# Patient Record
Sex: Female | Born: 1965 | Race: White | Hispanic: No | Marital: Married | Smoking: Former smoker
Health system: Southern US, Community
[De-identification: ages and names within clinical notes are randomized; demographics above are authoritative.]

---

## 2014-12-25 ENCOUNTER — Emergency Department
Admission: EM | Admit: 2014-12-25 | Discharge: 2014-12-25 | Disposition: A | Discharge status: Home / Self Care | Payer: BLUE CROSS/BLUE SHIELD | Attending: Student | Admitting: Student

## 2014-12-25 ENCOUNTER — Other Ambulatory Visit: Payer: Self-pay

## 2014-12-25 ENCOUNTER — Emergency Department: Payer: BLUE CROSS/BLUE SHIELD

## 2014-12-25 DIAGNOSIS — R002 Palpitations: Secondary | ICD-10-CM | POA: Insufficient documentation

## 2014-12-25 DIAGNOSIS — M25512 Pain in left shoulder: Secondary | ICD-10-CM | POA: Diagnosis not present

## 2014-12-25 DIAGNOSIS — Z87891 Personal history of nicotine dependence: Secondary | ICD-10-CM | POA: Insufficient documentation

## 2014-12-25 DIAGNOSIS — Z3202 Encounter for pregnancy test, result negative: Secondary | ICD-10-CM | POA: Diagnosis not present

## 2014-12-25 DIAGNOSIS — R202 Paresthesia of skin: Secondary | ICD-10-CM | POA: Diagnosis not present

## 2014-12-25 LAB — BASIC METABOLIC PANEL
Anion gap: 8 (ref 5–15)
BUN: 15 mg/dL (ref 6–20)
CALCIUM: 9.5 mg/dL (ref 8.9–10.3)
CO2: 26 mmol/L (ref 22–32)
CREATININE: 0.78 mg/dL (ref 0.44–1.00)
Chloride: 105 mmol/L (ref 101–111)
Glucose, Bld: 111 mg/dL — ABNORMAL HIGH (ref 65–99)
Potassium: 3.6 mmol/L (ref 3.5–5.1)
Sodium: 139 mmol/L (ref 135–145)

## 2014-12-25 LAB — CBC
HCT: 43.3 % (ref 35.0–47.0)
Hemoglobin: 14.2 g/dL (ref 12.0–16.0)
MCH: 29.8 pg (ref 26.0–34.0)
MCHC: 32.7 g/dL (ref 32.0–36.0)
MCV: 91.1 fL (ref 80.0–100.0)
PLATELETS: 215 10*3/uL (ref 150–440)
RBC: 4.75 MIL/uL (ref 3.80–5.20)
RDW: 13.7 % (ref 11.5–14.5)
WBC: 7.6 10*3/uL (ref 3.6–11.0)

## 2014-12-25 LAB — TSH: TSH: 1.673 u[IU]/mL (ref 0.350–4.500)

## 2014-12-25 LAB — TROPONIN I

## 2014-12-25 LAB — T4, FREE: Free T4: 1.03 ng/dL (ref 0.61–1.12)

## 2014-12-25 NOTE — ED Notes (Signed)
Pt c/o left arm tingling for the past 3-4 days, today having palpitations, feeling like her heart is racing with diaphoresis and nausea.Carla Hamilton

## 2014-12-25 NOTE — ED Notes (Signed)
POC urine pregnancy completed and negative. MD aware

## 2014-12-25 NOTE — ED Notes (Signed)
Patient transported to X-ray 

## 2014-12-25 NOTE — ED Provider Notes (Signed)
[facility] Emergency Department Provider Note  ____________________________________________  Time seen: Approximately 11:38 AM  I have reviewed the triage vital signs and the nursing notes.   HISTORY  Chief Complaint Palpitations    HPI Carla Hamilton is a 49 y.o. female with no chronic medical problems who presents for evaluation of gradual onset mild intermittent left shoulder throbbing discomfort with associated tingling in the left arm which began 4 days ago. She thought this may be due to a pinched nerve. This morning she also felt like she felt her heart beating quickly and felt a little nauseated and this concerned her. Her symptoms are not exertional, not pleuritic and she denies any chest pain at any time. No shortness of breath. Current severity of symptoms is mild. No modifying factors. No recent illness and she is otherwise been in her usual state of health.   History reviewed. No pertinent past medical history.  There are no active problems to display for this patient.   History reviewed. No pertinent past surgical history.  No current outpatient prescriptions on file.  Allergies Review of patient's allergies indicates no known allergies.  No family history on file.  Social History Social History  Substance Use Topics  . Smoking status: Former Games developer  . Smokeless tobacco: Never Used  . Alcohol Use: Yes    Review of Systems Constitutional: No fever/chills Eyes: No visual changes. ENT: No sore throat. Cardiovascular: Denies chest pain. Respiratory: Denies shortness of breath. Gastrointestinal: No abdominal pain.  No nausea, no vomiting.  No diarrhea.  No constipation. Genitourinary: Negative for dysuria. Musculoskeletal: Negative for back pain. Skin: Negative for rash. Neurological: Negative for headaches, focal weakness or numbness.  10-point ROS otherwise  negative.  ____________________________________________   PHYSICAL EXAM:  VITAL SIGNS: ED Triage Vitals  Enc Vitals Group     BP 12/25/14 0909 119/54 mmHg     Pulse Rate 12/25/14 0909 92     Resp 12/25/14 0909 18     Temp 12/25/14 0909 97.9 F (36.6 C)     Temp Source 12/25/14 0909 Oral     SpO2 12/25/14 0909 100 %     Weight 12/25/14 0909 135 lb (61.236 kg)     Height 12/25/14 0909  (1.626 m)     Head Cir --      Peak Flow --      Pain Score 12/25/14 0909 4     Pain Loc --      Pain Edu? --      Excl. in GC? --     Constitutional: Alert and oriented. Well appearing and in no acute distress. Eyes: Conjunctivae are normal. PERRL. EOMI. Head: Atraumatic. Nose: No congestion/rhinnorhea. Mouth/Throat: Mucous membranes are moist.  Oropharynx non-erythematous. Neck: No stridor. Supple without meningismus. Cardiovascular: Normal rate, regular rhythm. Grossly normal heart sounds.  Good peripheral circulation. Respiratory: Normal respiratory effort.  No retractions. Lungs CTAB. Gastrointestinal: Soft and nontender. No distention. No abdominal bruits. No CVA tenderness. Genitourinary: deferred Musculoskeletal: No lower extremity tenderness nor edema.  No joint effusions. Mild tenderness to palpation in the left trapezius muscle. Neurologic:  Normal speech and language. No gross focal neurologic deficits are appreciated. No gait instability. 5 out of 5 strength in bilateral upper and lower extremities, sensation intact to light touch throughout. Cranial nerves II through XII intact. Skin:  Skin is warm, dry and intact. No rash noted. Psychiatric: Mood and affect are normal. Speech and behavior are normal.  ____________________________________________   LABS (  all labs ordered are listed, but only abnormal results are displayed)  Labs Reviewed  BASIC METABOLIC PANEL - Abnormal; Notable for the following:    Glucose, Bld 111 (*)    All other components within normal limits   CBC  TROPONIN I  TSH  T4, FREE  POC URINE PREG, ED   ____________________________________________  EKG  ED ECG REPORT I, Gayla Doss, the attending physician, personally viewed and interpreted this ECG.   Date: 12/25/2014  EKG Time: 09:13  Rate: 80  Rhythm: normal sinus rhythm with sinus arrhythmia  Axis: Normal  Intervals:none  ST&T Change: No acute ST segment elevation. Q-wave in V2 is nonspecific.  ____________________________________________  RADIOLOGY  CXR  FINDINGS: There are apparent nipple shadows bilaterally. Lungs are clear. Heart size and pulmonary vascularity are normal. No adenopathy. No bone lesions.  IMPRESSION: No edema or consolidation. Nipple shadows are noted bilaterally.  ____________________________________________   PROCEDURES  Procedure(s) performed: None  Critical Care performed: No  ____________________________________________   INITIAL IMPRESSION / ASSESSMENT AND PLAN / ED COURSE  Pertinent labs & imaging results that were available during my care of the patient were reviewed by me and considered in my medical decision making (see chart for details).  Carla Hamilton is a 49 y.o. female with no chronic medical problems who presents for evaluation of gradual onset mild intermittent left shoulder throbbing discomfort with associated tingling in the left arm which began 4 days ago. On exam, she is very well-appearing and in no acute distress. Vital signs stable, she is afebrile. She has mild tenderness to palpation in the left trapezius but intact sensation and strength all extremities. Suspect possible mild cervical radiculopathy on the left without weakness. While she complains of palpitations, EKG is normal sinus with sinus arrhythmia, no arrhythmia while observed in the ER. . She does drink quite a bit of caffeine, I have counseled her on reducing caffeine intake. normal BMP, normal CBC. TSH is within normal limits. EKG shows no  evidence of acute ischemia, she has no chest pain, no difficulty breathing. Heart score 3, troponin less than 0.03. I have low suspicion for ACS. Doubt acute CVA as the cause of her intermittent paresthesias in the left arm and she is neurovascularly intact. We discussed return precautions and need for close PCP follow-up and she is comfortable with the discharge plan. Point-of-care pregnancy negative. Chest x-ray clear. ____________________________________________   FINAL CLINICAL IMPRESSION(S) / ED DIAGNOSES  Final diagnoses:  Heart palpitations  Paresthesia      Gayla Doss, MD 12/25/14 1220

## 2014-12-26 LAB — POCT PREGNANCY, URINE: Preg Test, Ur: NEGATIVE

## 2016-10-21 ENCOUNTER — Emergency Department: Payer: BLUE CROSS/BLUE SHIELD

## 2016-10-21 ENCOUNTER — Encounter: Payer: Self-pay | Admitting: Emergency Medicine

## 2016-10-21 ENCOUNTER — Emergency Department
Admission: EM | Admit: 2016-10-21 | Discharge: 2016-10-21 | Disposition: A | Discharge status: Home / Self Care | Payer: BLUE CROSS/BLUE SHIELD | Attending: Emergency Medicine | Admitting: Emergency Medicine

## 2016-10-21 DIAGNOSIS — Y939 Activity, unspecified: Secondary | ICD-10-CM | POA: Diagnosis not present

## 2016-10-21 DIAGNOSIS — Y929 Unspecified place or not applicable: Secondary | ICD-10-CM | POA: Diagnosis not present

## 2016-10-21 DIAGNOSIS — M79662 Pain in left lower leg: Secondary | ICD-10-CM | POA: Diagnosis not present

## 2016-10-21 DIAGNOSIS — Y999 Unspecified external cause status: Secondary | ICD-10-CM | POA: Insufficient documentation

## 2016-10-21 DIAGNOSIS — M79641 Pain in right hand: Secondary | ICD-10-CM | POA: Insufficient documentation

## 2016-10-21 DIAGNOSIS — R58 Hemorrhage, not elsewhere classified: Secondary | ICD-10-CM | POA: Insufficient documentation

## 2016-10-21 NOTE — Discharge Instructions (Signed)
Please seek medical attention for any high fevers, chest pain, shortness of breath, change in behavior, persistent vomiting, bloody stool or any other new or concerning symptoms.  
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
## 2018-11-22 ENCOUNTER — Telehealth: Payer: Self-pay

## 2018-11-22 ENCOUNTER — Other Ambulatory Visit: Payer: Self-pay | Admitting: Internal Medicine

## 2018-11-22 DIAGNOSIS — Z20822 Contact with and (suspected) exposure to covid-19: Secondary | ICD-10-CM

## 2018-11-22 NOTE — Telephone Encounter (Signed)
Pt called stating husband tested positive for Covid 19 today; states she took the Pixel test required by employer on MOnday and result was negative but started exhibiting symptoms on Monday afternoon, states she has had fever up to 101.0 degrees; she has traveled to [place] , [place] in the last 14 days; she has not been on campus since July 16th; states she has quarantined since Sunday when her husband developed symptoms; instructed to go for testing at the [facility] drive through testing site at [facility] and pt verb u/o; states she has spoken with 17y.o son's pediatrician and will have him tested as well; she states she has notified her supervisor and encouraged her to contact HR for any questions regarding time away from work, return to work, Social research officer, government and she verb u/o

## 2018-11-24 LAB — NOVEL CORONAVIRUS, NAA: SARS-CoV-2, NAA: DETECTED — AB

## 2019-02-21 ENCOUNTER — Ambulatory Visit: Payer: BC Managed Care – PPO

## 2019-02-21 ENCOUNTER — Other Ambulatory Visit: Payer: Self-pay

## 2019-02-21 DIAGNOSIS — Z23 Encounter for immunization: Secondary | ICD-10-CM

## 2019-06-30 ENCOUNTER — Ambulatory Visit: Payer: Self-pay | Attending: Internal Medicine

## 2019-06-30 DIAGNOSIS — Z23 Encounter for immunization: Secondary | ICD-10-CM | POA: Insufficient documentation

## 2019-06-30 NOTE — Progress Notes (Signed)
   Covid-19 Vaccination Clinic  Name:  Carla Hamilton    MRN: [id number] DOB: 05-27-1965  06/30/2019  [name] was observed post Covid-19 immunization for 15 minutes without incident. She was provided with Vaccine Information Sheet and instruction to access the V-Safe system.   [name] was instructed to call 911 with any severe reactions post vaccine: Marland Kitchen Difficulty breathing  . Swelling of face and throat  . A fast heartbeat  . A bad rash all over body  . Dizziness and weakness   Immunizations Administered    Name Date Dose VIS Date Route   Pfizer COVID-19 Vaccine 06/30/2019  9:38 AM 0.3 mL 04/04/2019 Intramuscular   Manufacturer: [employer], [employer]   Lot: [id number]   NDC: [address]-8

## 2019-07-22 ENCOUNTER — Ambulatory Visit: Payer: Self-pay | Attending: Internal Medicine

## 2019-07-22 DIAGNOSIS — Z23 Encounter for immunization: Secondary | ICD-10-CM

## 2019-07-22 NOTE — Progress Notes (Signed)
   Covid-19 Vaccination Clinic  Name:  Kristilyn Coltrane    MRN: [id number] DOB: 1965-11-18  07/22/2019  Ms. Okun was observed post Covid-19 immunization for 15 minutes without incident. She was provided with Vaccine Information Sheet and instruction to access the V-Safe system.   Ms. Henney was instructed to call 911 with any severe reactions post vaccine: Marland Kitchen Difficulty breathing  . Swelling of face and throat  . A fast heartbeat  . A bad rash all over body  . Dizziness and weakness   Immunizations Administered    Name Date Dose VIS Date Route   Pfizer COVID-19 Vaccine 07/22/2019 11:11 AM 0.3 mL 04/04/2019 Intramuscular   Manufacturer: [employer], [employer]   Lot: [id number]   NDC: [address]-6

## 2021-02-04 ENCOUNTER — Other Ambulatory Visit: Payer: Self-pay | Admitting: Emergency Medicine

## 2021-02-04 DIAGNOSIS — Z139 Encounter for screening, unspecified: Secondary | ICD-10-CM

## 2021-02-08 ENCOUNTER — Other Ambulatory Visit: Payer: Self-pay

## 2021-02-08 ENCOUNTER — Ambulatory Visit
Admission: RE | Admit: 2021-02-08 | Discharge: 2021-02-08 | Disposition: A | Discharge status: Home / Self Care | Payer: BC Managed Care – PPO | Source: Ambulatory Visit | Attending: Emergency Medicine | Admitting: Emergency Medicine

## 2021-02-08 DIAGNOSIS — Z139 Encounter for screening, unspecified: Secondary | ICD-10-CM

## 2023-06-04 IMAGING — MG MM DIGITAL SCREENING BILAT W/ TOMO AND CAD
8 series · 9 of 24 positions shown · non-contrast
Comparison: None.

CLINICAL DATA: Screening.

EXAM:
DIGITAL SCREENING BILATERAL MAMMOGRAM WITH TOMOSYNTHESIS AND CAD
TECHNIQUE: Bilateral screening digital craniocaudal and mediolateral oblique
mammograms were obtained. Bilateral screening digital breast
tomosynthesis was performed. The images were evaluated with
computer-aided detection.

[R MLO synth-2D]
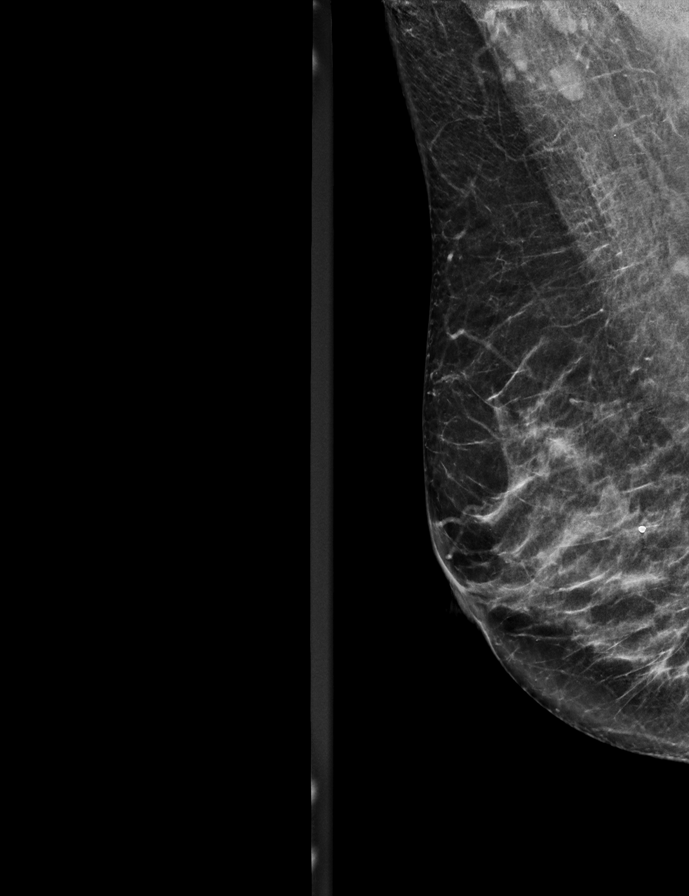

[R CC synth-2D]
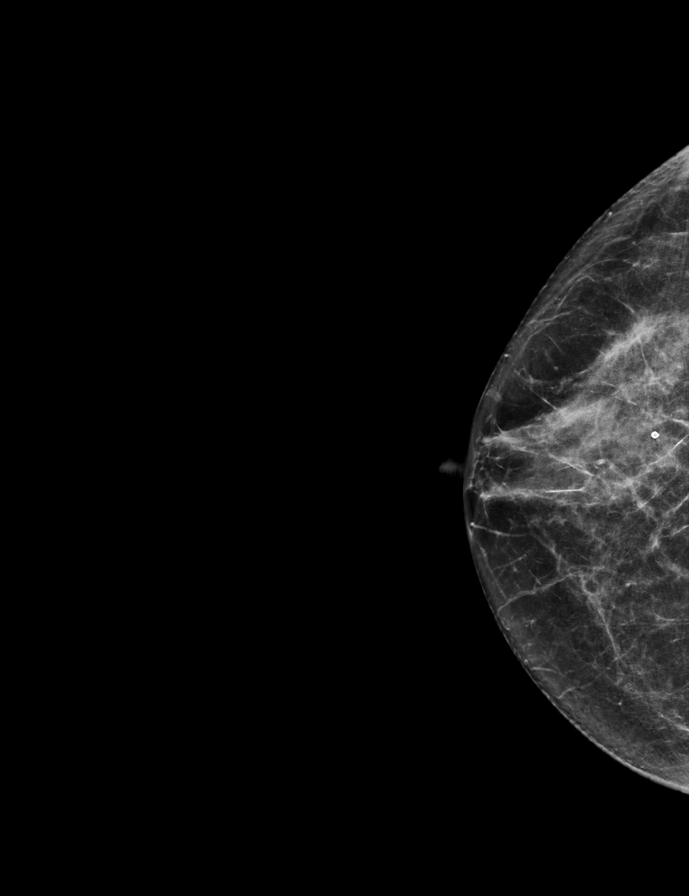

[L MLO synth-2D]
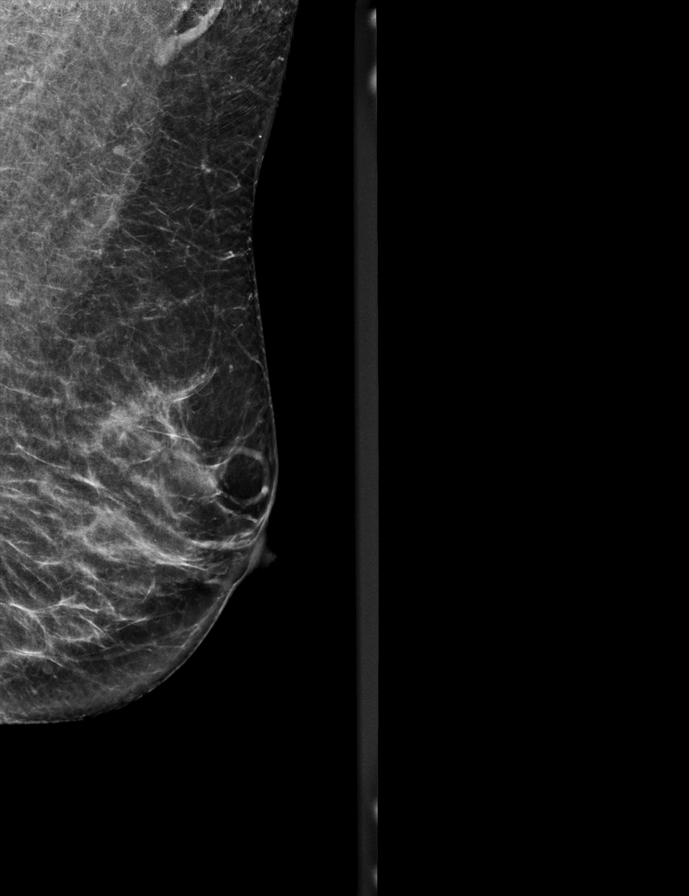

[L CC synth-2D]
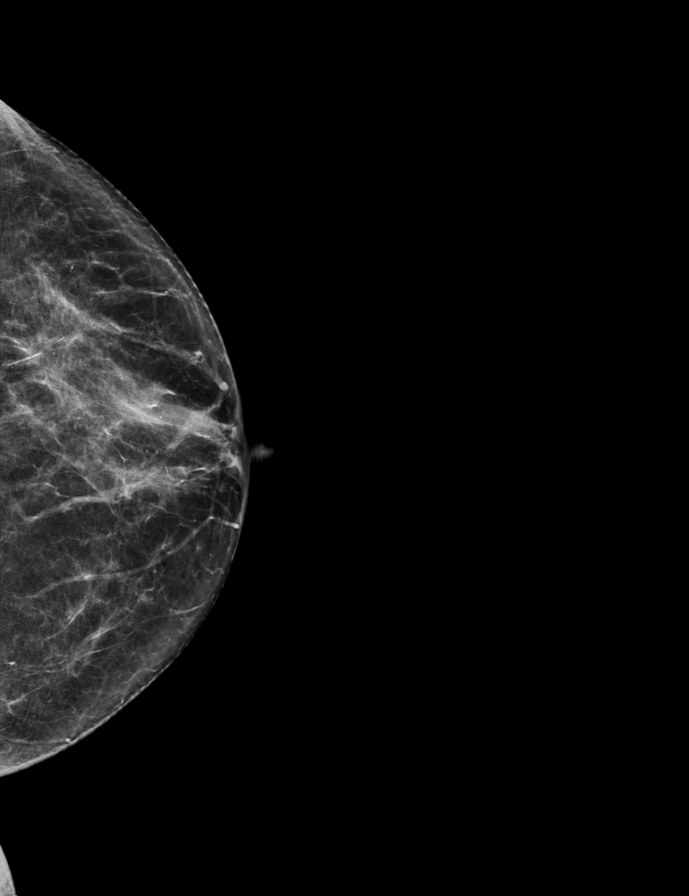

[L MLO tomo · 2 of 59 frames shown]
[frame 20/59]
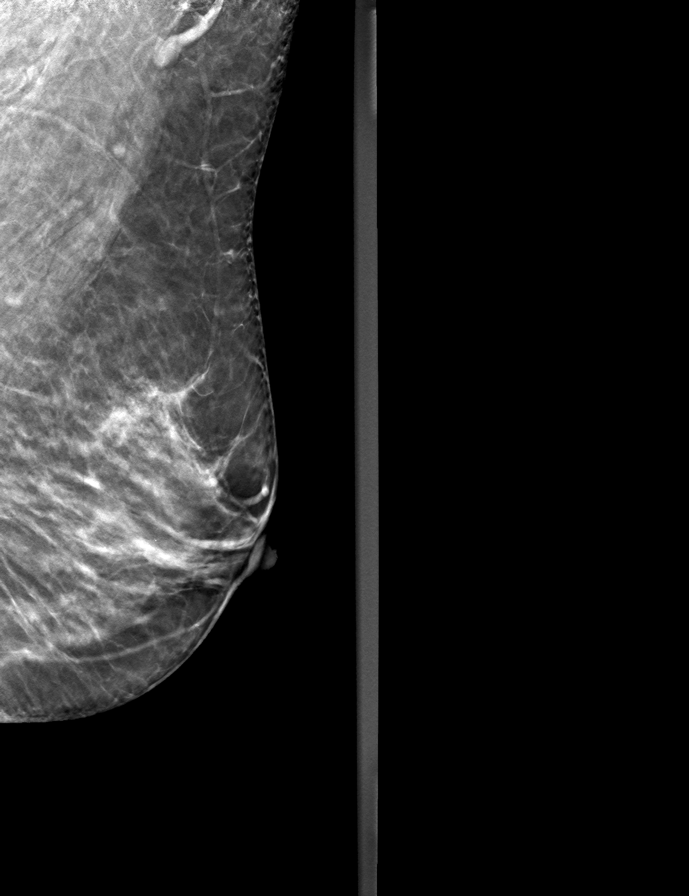
[frame 30/59]
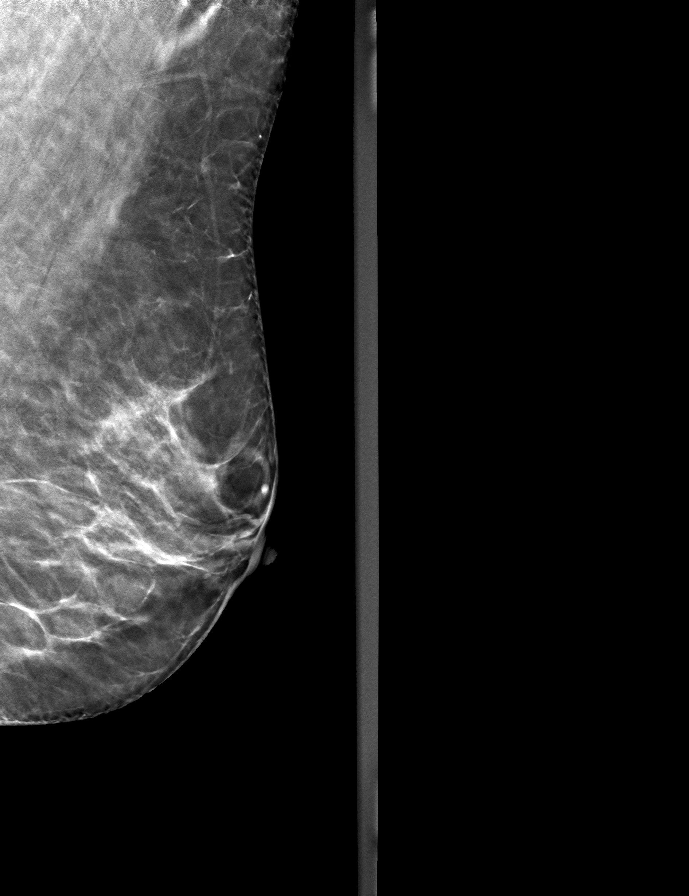

[R MLO tomo · tomo slice 30/59.0]
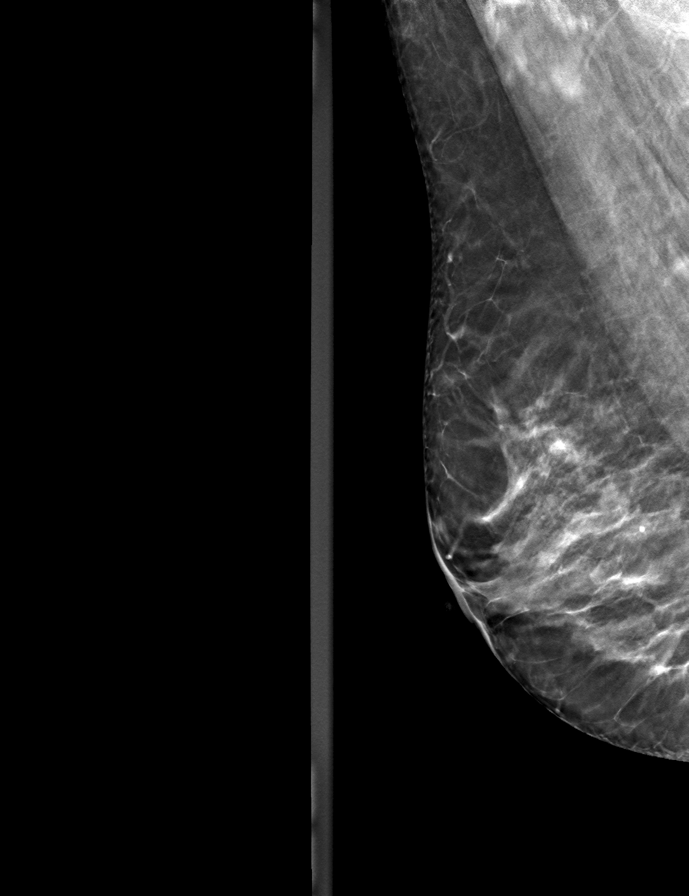

[L CC tomo · tomo slice 29/56.0]
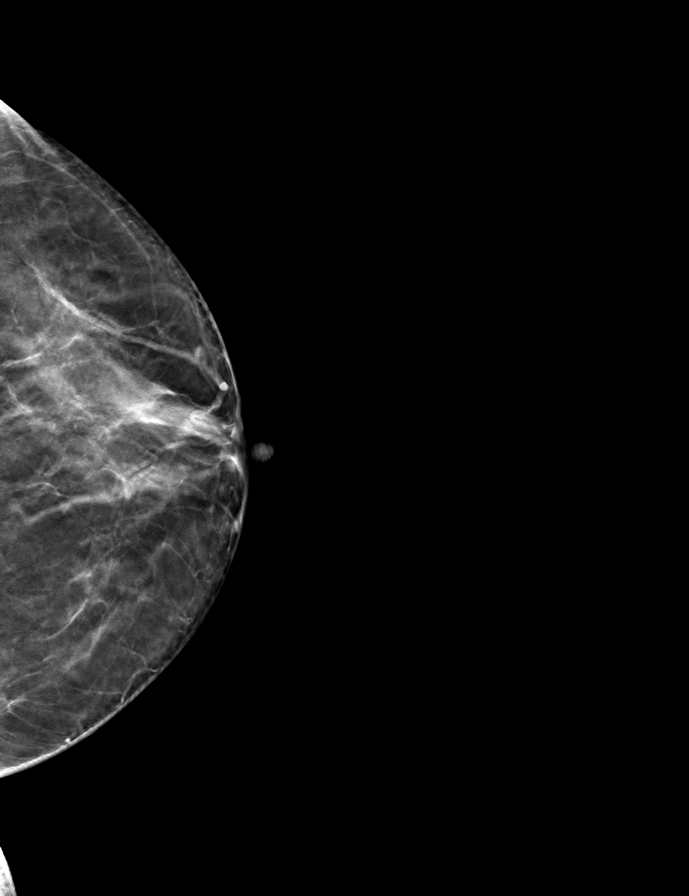

[R CC tomo · tomo slice 29/57.0]
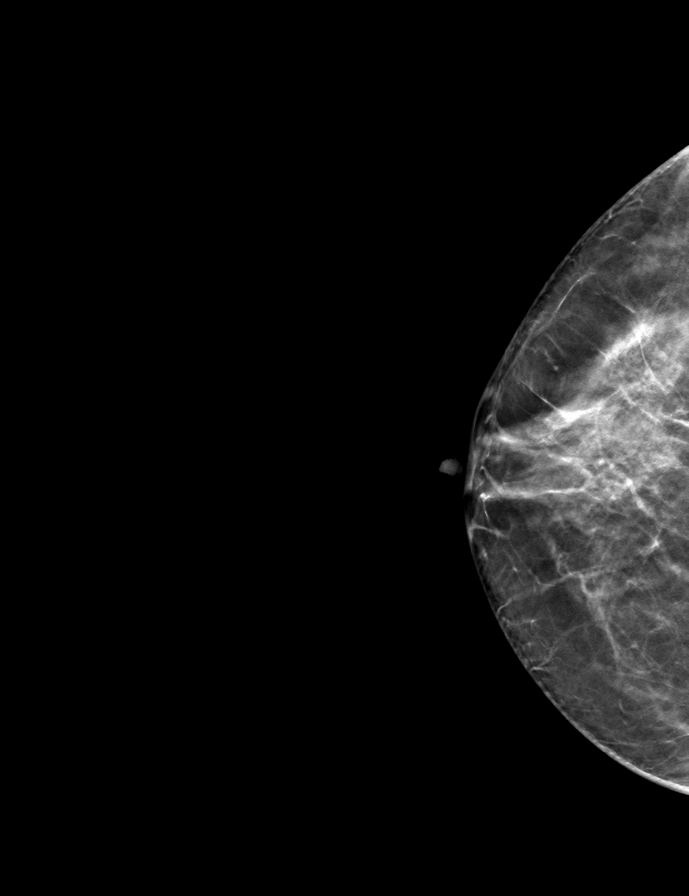

[9 of 24 positions shown; findings below may reference images not displayed]

ACR Breast Density Category c: The breast tissue is heterogeneously
dense, which may obscure small masses
FINDINGS: There are no findings suspicious for malignancy.
IMPRESSION: No mammographic evidence of malignancy. A result letter of this
screening mammogram will be mailed directly to the patient.

RECOMMENDATION:
Screening mammogram in one year. (Code:C8-T-HNK)

BI-RADS CATEGORY  1: Negative.

## 2024-02-13 ENCOUNTER — Other Ambulatory Visit: Payer: Self-pay | Admitting: Adult Health

## 2024-02-13 DIAGNOSIS — Z1231 Encounter for screening mammogram for malignant neoplasm of breast: Secondary | ICD-10-CM

## 2024-02-20 ENCOUNTER — Ambulatory Visit
Admission: RE | Admit: 2024-02-20 | Discharge: 2024-02-20 | Disposition: A | Source: Ambulatory Visit | Attending: Adult Health

## 2024-02-20 DIAGNOSIS — Z1231 Encounter for screening mammogram for malignant neoplasm of breast: Secondary | ICD-10-CM
